# Patient Record
Sex: Female | Born: 2000
Health system: Southern US, Community
[De-identification: ages and names within clinical notes are randomized; demographics above are authoritative.]

## PROBLEM LIST (undated history)

## (undated) DIAGNOSIS — R519 Headache, unspecified: Secondary | ICD-10-CM

## (undated) DIAGNOSIS — J45909 Unspecified asthma, uncomplicated: Secondary | ICD-10-CM

## (undated) DIAGNOSIS — F329 Major depressive disorder, single episode, unspecified: Secondary | ICD-10-CM

## (undated) DIAGNOSIS — F32A Depression, unspecified: Secondary | ICD-10-CM

## (undated) HISTORY — DX: Depression, unspecified: F32.A

## (undated) HISTORY — DX: Unspecified asthma, uncomplicated: J45.909

## (undated) HISTORY — DX: Headache, unspecified: R51.9

---
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
## 2021-01-05 DIAGNOSIS — Z713 Dietary counseling and surveillance: Secondary | ICD-10-CM | POA: Diagnosis not present

## 2021-01-06 DIAGNOSIS — F502 Bulimia nervosa: Secondary | ICD-10-CM | POA: Diagnosis not present

## 2021-01-11 DIAGNOSIS — Z23 Encounter for immunization: Secondary | ICD-10-CM | POA: Diagnosis not present

## 2021-01-12 ENCOUNTER — Other Ambulatory Visit: Payer: Self-pay | Admitting: Pediatrics

## 2021-01-12 DIAGNOSIS — F411 Generalized anxiety disorder: Secondary | ICD-10-CM

## 2021-01-12 DIAGNOSIS — F331 Major depressive disorder, recurrent, moderate: Secondary | ICD-10-CM

## 2021-01-12 MED ORDER — SERTRALINE HCL 100 MG PO TABS: 100.0000 mg | ORAL_TABLET | Freq: Every day | ORAL | 1 refills | Status: DC

## 2021-01-19 DIAGNOSIS — Z713 Dietary counseling and surveillance: Secondary | ICD-10-CM | POA: Diagnosis not present

## 2021-01-20 DIAGNOSIS — F502 Bulimia nervosa: Secondary | ICD-10-CM | POA: Diagnosis not present

## 2021-01-24 DIAGNOSIS — E2839 Other primary ovarian failure: Secondary | ICD-10-CM | POA: Diagnosis not present

## 2021-01-24 DIAGNOSIS — F509 Eating disorder, unspecified: Secondary | ICD-10-CM | POA: Diagnosis not present

## 2021-01-25 DIAGNOSIS — F502 Bulimia nervosa: Secondary | ICD-10-CM | POA: Diagnosis not present

## 2021-01-26 DIAGNOSIS — Z713 Dietary counseling and surveillance: Secondary | ICD-10-CM | POA: Diagnosis not present

## 2021-02-09 DIAGNOSIS — Z713 Dietary counseling and surveillance: Secondary | ICD-10-CM | POA: Diagnosis not present

## 2021-02-24 DIAGNOSIS — Z713 Dietary counseling and surveillance: Secondary | ICD-10-CM | POA: Diagnosis not present

## 2021-03-23 DIAGNOSIS — Z713 Dietary counseling and surveillance: Secondary | ICD-10-CM | POA: Diagnosis not present

## 2021-06-30 DIAGNOSIS — R7989 Other specified abnormal findings of blood chemistry: Secondary | ICD-10-CM | POA: Diagnosis not present

## 2021-06-30 DIAGNOSIS — N912 Amenorrhea, unspecified: Secondary | ICD-10-CM | POA: Diagnosis not present

## 2021-07-12 ENCOUNTER — Ambulatory Visit (INDEPENDENT_AMBULATORY_CARE_PROVIDER_SITE_OTHER): Payer: BC Managed Care – PPO | Admitting: Family

## 2021-07-12 ENCOUNTER — Encounter: Payer: Self-pay | Admitting: Family

## 2021-07-12 VITALS — BP 100/59 | HR 62 | Ht 63.78 in | Wt 125.0 lb

## 2021-07-12 DIAGNOSIS — Z113 Encounter for screening for infections with a predominantly sexual mode of transmission: Secondary | ICD-10-CM

## 2021-07-12 DIAGNOSIS — N926 Irregular menstruation, unspecified: Secondary | ICD-10-CM | POA: Diagnosis not present

## 2021-07-12 DIAGNOSIS — F509 Eating disorder, unspecified: Secondary | ICD-10-CM

## 2021-07-12 DIAGNOSIS — F411 Generalized anxiety disorder: Secondary | ICD-10-CM | POA: Diagnosis not present

## 2021-07-12 DIAGNOSIS — Z1331 Encounter for screening for depression: Secondary | ICD-10-CM

## 2021-07-12 NOTE — Addendum Note (Signed)
Addended by: Ardeth Sportsman on: 07/12/2021 03:55 PM ? ? Modules accepted: Orders ? ?

## 2021-07-12 NOTE — Progress Notes (Signed)
History was provided by the patient and mother. ? ?Elizabeth Werner is a 21 y.o. female who is here for irregular periods, eating disorder, recovered, GAD.  ? ?PCP confirmed? Yes.   ? [name], MD ? ?Plan from last visit:  ? ?01/01/20 ?Assessment/Plan: ?1. Major depressive disorder, recurrent episode, moderate ([place]) ?Currently doing well without depressive sx.  ?  ?2. Generalized anxiety disorder ?Anxiety continues to be persistent. We will increase sertraline to 150 mg daily and hold there for 4 weeks. Could consider medication switch if this is not satisfactory.  ?  ?3. Eating disorder, unspecified type ?Stable.  ? ? ?-now at sertraline 200 mg  ?-last period in January - prolonged and dysmenorrhea with emotional symptoms  ?-[name], MD - [name], [place]; nutritionist: Dannette Barbara, [place] saw them late September/October  ? ?-in maintenance for [place] ? ?-not/never sexually active  ?-attracted to female partners  ? ?-has been getting really bloated, stomach feels sick since January  ?-feels like any of the times with bleeding no cramping  ? ?-headaches all her life; not worse or different; recent eye exam with perfect vision  ?-no purging since Christmas or since period in January  ?-no throat soreness or voice changes  ?-laxative use stopped in November/December  ?-no bingeing since then also  ?-[employer] 5 days per week, walking daily at school  ?-every days 3 meals + 2 snacks, probably would say needs more diverse nutrition ?-no follow up scheduled with nutritionist  ?-doesn't poop or feel like she poops enough  ?-hasn't had a lot of water since Friday because she likes to drink from [place] 40 oz x 2  ?-wisdom teeth out last Friday  ? ? ?Patient Active Problem List  ? Diagnosis Date Noted  ? Dumping syndrome 08/13/2019  ? Secondary oligomenorrhea 05/08/2019  ? Major depressive disorder, recurrent episode, moderate ([place]) 03/25/2019  ? Disordered eating 03/18/2019  ? Generalized anxiety disorder 11/20/2018   ? Adolescent idiopathic scoliosis of thoracic region 06/21/2016  ? ? ?Current Outpatient Medications on File Prior to Visit  ?Medication Sig Dispense Refill  ? albuterol (PROVENTIL HFA;VENTOLIN HFA) 108 (90 Base) MCG/ACT inhaler Inhale 2 puffs into the lungs every 6 (six) hours as needed for wheezing or shortness of breath. 1 Inhaler 0  ? sertraline (ZOLOFT) 100 MG tablet Take 1 tablet (100 mg total) by mouth daily. 30 tablet 1  ? ?No current facility-administered medications on file prior to visit.  ? ? ?No Known Allergies ? ?Physical Exam:  ?  ?Vitals:  ? 07/12/21 1012  ?BP: (!) 100/59  ?Pulse: 62  ?Weight: 125 lb (56.7 kg)  ?Height: 5' 3.78" (1.62 m)  ? ?Wt Readings from Last 3 Encounters:  ?07/12/21 125 lb (56.7 kg) (45 %, Z= -0.14)*  ?05/05/19 128 lb (58.1 kg) (60 %, Z= 0.26)*  ?04/07/19 124 lb 12.8 oz (56.6 kg) (55 %, Z= 0.12)*  ? ?* Growth percentiles are based on CDC (Girls, 2-20 Years) data.  ?  ? ?Blood pressure percentiles are not available for patients who are 18 years or older. ?No LMP recorded. ? ?Physical Exam ?Constitutional:   ?   General: She is not in acute distress. ?   Appearance: She is well-developed.  ?HENT:  ?   Head: Normocephalic and atraumatic.  ?Eyes:  ?   General: No scleral icterus. ?   Pupils: Pupils are equal, round, and reactive to light.  ?Neck:  ?   Thyroid: No thyromegaly.  ?Cardiovascular:  ?  Rate and Rhythm: Normal rate and regular rhythm.  ?   Heart sounds: Normal heart sounds. No murmur heard. ?Pulmonary:  ?   Effort: Pulmonary effort is normal.  ?   Breath sounds: Normal breath sounds.  ?Abdominal:  ?   Palpations: Abdomen is soft.  ?Musculoskeletal:     ?   General: Normal range of motion.  ?   Cervical back: Normal range of motion and neck supple.  ?Lymphadenopathy:  ?   Cervical: No cervical adenopathy.  ?Skin: ?   General: Skin is warm and dry.  ?   Findings: No rash.  ?   Comments: No acne  ?No appreciable hirsutism   ?Neurological:  ?   Mental Status: She is alert  and oriented to person, place, and time.  ?   Cranial Nerves: No cranial nerve deficit.  ?Psychiatric:     ?   Behavior: Behavior normal.     ?   Thought Content: Thought content normal.     ?   Judgment: Judgment normal.  ? ? ?  07/12/2021  ?  3:22 PM 12/23/2019  ?  9:20 AM 04/07/2019  ?  1:32 PM  ?PHQ-SADS Last 3 Score only  ?PHQ-15 Score 8 7 7   ?Total GAD-7 Score 4 8 7   ?PHQ Adolescent Score 5 3 4   ?  ? ?Assessment/Plan: ?1. Irregular periods ?2. Generalized anxiety disorder ?3. Eating disorder, unspecified type ? ?We discussed reasons for irregular cycles including H-P-O axis immaturity, thyroid, pituitary, and other endocrine or hypothalamic dysfunctions, other causes of ovulatory dysfunction secondary to hyperandrogenism, PCOS, and the possibility of structural or anatomical anomalies. Will obtain lab work today to rule in/rule out the above. Will review labs and notes from Dr [name] and GYN to assess what labs are additionally needed. Discussed progesterone challenge and need for GU exam at next visit. Weight and vitals stable.  ? ? ? ?

## 2021-07-13 LAB — C. TRACHOMATIS/N. GONORRHOEAE RNA
C. trachomatis RNA, TMA: NOT DETECTED
N. gonorrhoeae RNA, TMA: NOT DETECTED

## 2021-07-14 ENCOUNTER — Encounter: Payer: Self-pay | Admitting: Family

## 2021-07-14 ENCOUNTER — Other Ambulatory Visit: Payer: Self-pay | Admitting: Family

## 2021-07-14 DIAGNOSIS — N926 Irregular menstruation, unspecified: Secondary | ICD-10-CM

## 2021-07-18 ENCOUNTER — Ambulatory Visit: Payer: BC Managed Care – PPO

## 2021-07-18 DIAGNOSIS — Z1329 Encounter for screening for other suspected endocrine disorder: Secondary | ICD-10-CM | POA: Diagnosis not present

## 2021-07-18 DIAGNOSIS — E221 Hyperprolactinemia: Secondary | ICD-10-CM | POA: Diagnosis not present

## 2021-07-18 DIAGNOSIS — N926 Irregular menstruation, unspecified: Secondary | ICD-10-CM | POA: Diagnosis not present

## 2021-07-18 DIAGNOSIS — E559 Vitamin D deficiency, unspecified: Secondary | ICD-10-CM | POA: Diagnosis not present

## 2021-07-19 ENCOUNTER — Ambulatory Visit: Payer: BC Managed Care – PPO | Admitting: Family

## 2021-07-19 ENCOUNTER — Ambulatory Visit (INDEPENDENT_AMBULATORY_CARE_PROVIDER_SITE_OTHER): Payer: BC Managed Care – PPO | Admitting: Family

## 2021-07-19 ENCOUNTER — Other Ambulatory Visit: Payer: Self-pay | Admitting: Family

## 2021-07-19 ENCOUNTER — Encounter: Payer: Self-pay | Admitting: Family

## 2021-07-19 DIAGNOSIS — N921 Excessive and frequent menstruation with irregular cycle: Secondary | ICD-10-CM

## 2021-07-19 DIAGNOSIS — N926 Irregular menstruation, unspecified: Secondary | ICD-10-CM

## 2021-07-19 DIAGNOSIS — N914 Secondary oligomenorrhea: Secondary | ICD-10-CM

## 2021-07-19 DIAGNOSIS — N911 Secondary amenorrhea: Secondary | ICD-10-CM | POA: Diagnosis not present

## 2021-07-19 DIAGNOSIS — E785 Hyperlipidemia, unspecified: Secondary | ICD-10-CM | POA: Diagnosis not present

## 2021-07-19 NOTE — Progress Notes (Signed)
Here for additional labs as hormonal labs were not in last lab draw.  ?Video visit on Friday to review labs.  ? ? ?

## 2021-07-22 ENCOUNTER — Encounter: Payer: Self-pay | Admitting: Family

## 2021-07-22 ENCOUNTER — Telehealth: Payer: BC Managed Care – PPO | Admitting: Family

## 2021-07-25 LAB — COMPREHENSIVE METABOLIC PANEL
AG Ratio: 2 (calc) (ref 1.0–2.5)
ALT: 17 U/L (ref 5–32)
AST: 17 U/L (ref 12–32)
Albumin: 4.9 g/dL (ref 3.6–5.1)
Alkaline phosphatase (APISO): 50 U/L (ref 36–128)
BUN/Creatinine Ratio: 24 (calc) — ABNORMAL HIGH (ref 6–22)
BUN: 21 mg/dL — ABNORMAL HIGH (ref 7–20)
CO2: 28 mmol/L (ref 20–32)
Calcium: 9.5 mg/dL (ref 8.9–10.4)
Chloride: 104 mmol/L (ref 98–110)
Creat: 0.89 mg/dL (ref 0.50–0.96)
Globulin: 2.5 g/dL (calc) (ref 2.0–3.8)
Glucose, Bld: 65 mg/dL (ref 65–139)
Potassium: 4.1 mmol/L (ref 3.8–5.1)
Sodium: 139 mmol/L (ref 135–146)
Total Bilirubin: 0.4 mg/dL (ref 0.2–1.1)
Total Protein: 7.4 g/dL (ref 6.3–8.2)

## 2021-07-25 LAB — IRON, TOTAL/TOTAL IRON BINDING CAP
%SAT: 32 % (calc) (ref 15–45)
Iron: 102 ug/dL (ref 27–164)
TIBC: 318 mcg/dL (calc) (ref 271–448)

## 2021-07-25 LAB — THYROID PANEL WITH TSH
Free Thyroxine Index: 2 (ref 1.4–3.8)
T3 Uptake: 31 % (ref 22–35)
T4, Total: 6.4 ug/dL (ref 5.3–11.7)
TSH: 1.84 mIU/L

## 2021-07-25 LAB — PROTIME-INR
INR: 1.1
Prothrombin Time: 11.2 s (ref 9.0–11.5)

## 2021-07-25 LAB — PHOSPHORUS: Phosphorus: 3.4 mg/dL (ref 2.7–5.0)

## 2021-07-25 LAB — PROLACTIN: Prolactin: 12.7 ng/mL

## 2021-07-25 LAB — VITAMIN D 25 HYDROXY (VIT D DEFICIENCY, FRACTURES): Vit D, 25-Hydroxy: 46 ng/mL (ref 30–100)

## 2021-07-25 LAB — VON WILLEBRAND COMPREHENSIVE PANEL
Factor-VIII Activity: 60 % normal (ref 50–180)
Ristocetin Co-Factor: 42 % normal (ref 42–200)
Von Willebrand Antigen, Plasma: 58 % (ref 50–217)
aPTT: 31 s (ref 23–32)

## 2021-07-25 LAB — FOLLICLE STIMULATING HORMONE: FSH: 7.1 m[IU]/mL

## 2021-07-25 LAB — MAGNESIUM: Magnesium: 2.1 mg/dL (ref 1.5–2.5)

## 2021-07-25 LAB — FERRITIN: Ferritin: 102 ng/mL (ref 16–154)

## 2021-07-26 ENCOUNTER — Telehealth: Payer: BC Managed Care – PPO | Admitting: Family

## 2021-07-26 DIAGNOSIS — F411 Generalized anxiety disorder: Secondary | ICD-10-CM

## 2021-07-26 LAB — HEMOGLOBIN A1C
Hgb A1c MFr Bld: 5.1 % of total Hgb (ref <5.7)
Mean Plasma Glucose: 100 mg/dL
eAG (mmol/L): 5.5 mmol/L

## 2021-07-26 LAB — ESTRADIOL: Estradiol: <15 pg/mL

## 2021-07-26 LAB — DHEA-SULFATE: DHEA-SO4: 106 ug/dL (ref 44–286)

## 2021-07-26 LAB — TEST AUTHORIZATION

## 2021-07-26 LAB — LUTEINIZING HORMONE: LH: 2.2 m[IU]/mL

## 2021-07-26 LAB — ANTI-MULLERIAN HORMONE (AMH), FEMALE: Anti-Mullerian Hormones(AMH), Female: 2.18 ng/mL (ref 1.02–14.63)

## 2021-07-26 LAB — LIPID PANEL
Cholesterol: 185 mg/dL — ABNORMAL HIGH (ref <170)
HDL: 68 mg/dL (ref >45)
LDL Cholesterol (Calc): 104 mg/dL (calc) (ref <110)
Non-HDL Cholesterol (Calc): 117 mg/dL (calc) (ref <120)
Total CHOL/HDL Ratio: 2.7 (calc) (ref <5.0)
Triglycerides: 51 mg/dL (ref <90)

## 2021-07-26 LAB — FOLLICLE STIMULATING HORMONE: FSH: 7.5 m[IU]/mL

## 2021-07-26 LAB — 17-HYDROXYPROGESTERONE: 17-OH-Progesterone, LC/MS/MS: 23 ng/dL

## 2021-07-26 LAB — TESTOS,TOTAL,FREE AND SHBG (FEMALE)
Free Testosterone: 4.1 pg/mL (ref 0.1–6.4)
Sex Hormone Binding: 95 nmol/L (ref 17–124)
Testosterone, Total, LC-MS-MS: 41 ng/dL (ref 2–45)

## 2021-07-26 LAB — ANDROSTENEDIONE: Androstenedione: 91 ng/dL

## 2021-07-27 ENCOUNTER — Other Ambulatory Visit: Payer: Self-pay | Admitting: Family

## 2021-07-27 ENCOUNTER — Encounter: Payer: Self-pay | Admitting: Family

## 2021-07-27 DIAGNOSIS — N914 Secondary oligomenorrhea: Secondary | ICD-10-CM

## 2021-07-27 MED ORDER — MEDROXYPROGESTERONE ACETATE 10 MG PO TABS: 10.0000 mg | ORAL_TABLET | Freq: Every day | ORAL | 0 refills | Status: DC

## 2021-07-28 ENCOUNTER — Other Ambulatory Visit: Payer: Self-pay | Admitting: Family

## 2021-07-28 DIAGNOSIS — F411 Generalized anxiety disorder: Secondary | ICD-10-CM

## 2021-07-28 DIAGNOSIS — F331 Major depressive disorder, recurrent, moderate: Secondary | ICD-10-CM

## 2021-07-28 MED ORDER — SERTRALINE HCL 100 MG PO TABS: 100.0000 mg | ORAL_TABLET | Freq: Every day | ORAL | 0 refills | Status: DC

## 2021-08-09 ENCOUNTER — Other Ambulatory Visit: Payer: Self-pay | Admitting: Family

## 2021-08-09 DIAGNOSIS — N914 Secondary oligomenorrhea: Secondary | ICD-10-CM

## 2021-08-09 DIAGNOSIS — E348 Other specified endocrine disorders: Secondary | ICD-10-CM

## 2021-08-10 ENCOUNTER — Other Ambulatory Visit: Payer: Self-pay | Admitting: Family

## 2021-08-10 DIAGNOSIS — E348 Other specified endocrine disorders: Secondary | ICD-10-CM

## 2021-08-10 DIAGNOSIS — N914 Secondary oligomenorrhea: Secondary | ICD-10-CM

## 2021-08-12 ENCOUNTER — Ambulatory Visit
Admission: RE | Admit: 2021-08-12 | Discharge: 2021-08-12 | Disposition: A | Discharge status: Home / Self Care | Payer: BC Managed Care – PPO | Source: Ambulatory Visit | Attending: Family | Admitting: Family

## 2021-08-12 ENCOUNTER — Other Ambulatory Visit: Payer: BC Managed Care – PPO

## 2021-08-12 DIAGNOSIS — E348 Other specified endocrine disorders: Secondary | ICD-10-CM | POA: Diagnosis not present

## 2021-08-12 DIAGNOSIS — N915 Oligomenorrhea, unspecified: Secondary | ICD-10-CM | POA: Diagnosis not present

## 2021-08-12 DIAGNOSIS — N914 Secondary oligomenorrhea: Secondary | ICD-10-CM

## 2021-08-13 LAB — LUTEINIZING HORMONE: LH: 0.4 m[IU]/mL — ABNORMAL LOW

## 2021-08-13 LAB — ESTRADIOL: Estradiol: 19 pg/mL

## 2021-08-13 LAB — FOLLICLE STIMULATING HORMONE: FSH: 9.1 m[IU]/mL

## 2021-08-17 ENCOUNTER — Other Ambulatory Visit: Payer: Self-pay | Admitting: Family

## 2021-08-17 ENCOUNTER — Encounter: Payer: Self-pay | Admitting: Family

## 2021-08-17 DIAGNOSIS — N912 Amenorrhea, unspecified: Secondary | ICD-10-CM

## 2021-08-17 DIAGNOSIS — E348 Other specified endocrine disorders: Secondary | ICD-10-CM

## 2021-08-25 ENCOUNTER — Encounter: Payer: Self-pay | Admitting: Family

## 2021-08-25 ENCOUNTER — Ambulatory Visit (INDEPENDENT_AMBULATORY_CARE_PROVIDER_SITE_OTHER): Payer: BC Managed Care – PPO | Admitting: Family

## 2021-08-25 VITALS — BP 104/64 | HR 68 | Wt 122.4 lb

## 2021-08-25 DIAGNOSIS — E348 Other specified endocrine disorders: Secondary | ICD-10-CM

## 2021-08-25 DIAGNOSIS — N912 Amenorrhea, unspecified: Secondary | ICD-10-CM | POA: Diagnosis not present

## 2021-08-25 DIAGNOSIS — F509 Eating disorder, unspecified: Secondary | ICD-10-CM | POA: Diagnosis not present

## 2021-08-25 DIAGNOSIS — K911 Postgastric surgery syndromes: Secondary | ICD-10-CM

## 2021-08-25 NOTE — Progress Notes (Unsigned)
History was provided by the {[email or web address]:[id number]}.  Elizabeth Werner is a 21 y.o. female who is here for ***.   PCP confirmed? 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 90 tablet 0   albuterol (PROVENTIL HFA;VENTOLIN HFA) 108 (90 Base) MCG/ACT inhaler Inhale 2 puffs into the lungs every 6 (six) hours as needed for wheezing or shortness of breath. (Patient not taking: Reported on 08/25/2021) 1 Inhaler 0   medroxyPROGESTERone (PROVERA) 10 MG tablet Take 1 tablet (10 mg total) by mouth daily. (Patient not taking: Reported on 08/25/2021) 10 tablet 0   No current facility-administered medications on file prior to visit.    No Known Allergies  Physical Exam:    Vitals:   08/25/21 1432  BP: 104/64  Pulse: 68  Weight: 122 lb 6.4 oz (55.5 kg)    Blood pressure %iles are not available for patients who are 18 years or older. No LMP recorded.  Physical Exam   Assessment/Plan: ***

## 2021-08-26 ENCOUNTER — Telehealth: Payer: Self-pay | Admitting: Pediatrics

## 2021-08-26 ENCOUNTER — Telehealth: Payer: Self-pay | Admitting: Family

## 2021-08-26 ENCOUNTER — Encounter: Payer: Self-pay | Admitting: Family

## 2021-08-26 NOTE — Telephone Encounter (Signed)
Patient appointment was scheduled for Sunday had to be cancel due to needing an authorization. Once authorization is obtained appointment can be rescheduled.

## 2021-08-26 NOTE — Telephone Encounter (Signed)
Received a call form [facility] stating that patient is schedule to have an MRI on Sunday but need prior authorization from provider. She states that they need to receive this authorization by 11:00am today otherwise the appointment will be cancel.  [phone]

## 2021-08-28 ENCOUNTER — Encounter: Payer: Self-pay | Admitting: Family

## 2021-08-28 ENCOUNTER — Other Ambulatory Visit: Payer: BC Managed Care – PPO

## 2021-09-10 ENCOUNTER — Other Ambulatory Visit: Payer: Self-pay | Admitting: Pediatrics

## 2021-09-14 ENCOUNTER — Other Ambulatory Visit: Payer: Self-pay | Admitting: Pediatrics

## 2021-09-14 DIAGNOSIS — F411 Generalized anxiety disorder: Secondary | ICD-10-CM

## 2021-09-14 DIAGNOSIS — F331 Major depressive disorder, recurrent, moderate: Secondary | ICD-10-CM

## 2021-09-14 MED ORDER — SERTRALINE HCL 100 MG PO TABS: 100.0000 mg | ORAL_TABLET | Freq: Every day | ORAL | 0 refills | Status: DC

## 2021-09-20 ENCOUNTER — Ambulatory Visit: Payer: BC Managed Care – PPO | Admitting: Family

## 2021-09-29 ENCOUNTER — Encounter: Payer: BC Managed Care – PPO | Admitting: Obstetrics and Gynecology

## 2021-10-03 ENCOUNTER — Encounter: Payer: Self-pay | Admitting: Family

## 2021-10-03 ENCOUNTER — Telehealth: Payer: Self-pay

## 2021-10-03 ENCOUNTER — Ambulatory Visit (INDEPENDENT_AMBULATORY_CARE_PROVIDER_SITE_OTHER): Payer: BC Managed Care – PPO | Admitting: Family

## 2021-10-03 VITALS — BP 104/64 | HR 65 | Ht 64.0 in | Wt 120.6 lb

## 2021-10-03 DIAGNOSIS — F509 Eating disorder, unspecified: Secondary | ICD-10-CM

## 2021-10-03 DIAGNOSIS — N912 Amenorrhea, unspecified: Secondary | ICD-10-CM | POA: Diagnosis not present

## 2021-10-03 DIAGNOSIS — R634 Abnormal weight loss: Secondary | ICD-10-CM

## 2021-10-03 DIAGNOSIS — E348 Other specified endocrine disorders: Secondary | ICD-10-CM

## 2021-10-03 DIAGNOSIS — F4323 Adjustment disorder with mixed anxiety and depressed mood: Secondary | ICD-10-CM | POA: Diagnosis not present

## 2021-10-03 MED ORDER — ESCITALOPRAM OXALATE 10 MG PO TABS: 10.0000 mg | ORAL_TABLET | Freq: Every day | ORAL | 0 refills | Status: DC

## 2021-10-03 NOTE — Telephone Encounter (Signed)
Mom requesting a call from Bernell List directly regarding today's appointment.

## 2021-10-03 NOTE — Progress Notes (Signed)
History was provided by the patient.  Elizabeth Werner is a 21 y.o. female who is here for adjustment disorder mixed anxiety and depressed mood, weight loss, amenorrhea.   PCP confirmed? Yes.    Elizabeth Hacker, MD  Plan from last visit on 08/25/21:  Assessment/Plan: 1. Amenorrhea 2. Estradiol deficiency   -GU exam today, external only with Q-tip pass through introitus without difficulty -review of growth metrics and growth chart, her amenorrhea may be 2/2 weight loss or weight changes at this time, particularly if her growth charts during childhood have her at 50-75th%tile, which would indicate she has more weight to restore. Of note, she had back to back periods in Jan and Feb 2021 at what time her weight was 128 lbs, so she may have ~ 5-10 lbs before period returns. Although never moderately or severely malnourished, her weight loss happened after late menarche and before her H-P-O axis matured. Her lowest recorded weight since menarche was 113 lbs on October 2020. Normal DXA on 01/2021.  -Due to low estradiol, no L ovary visualized, and small uterus noted on ultrasound, will refer to Elizabeth Werner for her expertise and for Elizabeth Werner to establish GYN care -discussed with Elizabeth Werner today that we will continue to focus on restoration and her relationship with food, anxiety.  -nutrition referral to Elizabeth Werner, [facility]  -no changes in sertraline today   -late entry: spoke to insurance company; PA not approved, will hold on MRI until Elizabeth Werner consult for her recommendations on imaging. Advised to [facility] via My Chart no imaging or any other work-up needed until her appt.  Will follow up 7/31 again.    Growth Metrics: ; Body mass index is 21.16 kg/m. Age in Months: 19 50th% median BMI for age: 103.68 % Ideal BMI 97.6%   Pertinent labs:  estradiol 19 (2 wks ago), <15 (one month ago) and 41 (2 yr ago) LH: 0.4 FSH: 9.1    Period history:  Reported periods back to back Jan and Feb 2021  per office visit 05/05/19 - of note, weight at that visit was 128 lbs    HPI:   -has taken sertraline for so long, she doesn't know if it is helping or not -gets irritable and mad; sometimes out of nowhere and then other times will just wake up in bad mood  -has started restricting and feels that is the reason  -poops are like nonexistent  -no therapist - has tried 4 in the past; saw last therapist 8-10 times  -takes sertraline at night  -recently has been having early morning wakings  -going back to school in 2-3 weeks  -new deodorant, has been smelling bad -     Patient Active Problem List   Diagnosis Date Noted   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 90 tablet 0   albuterol (PROVENTIL HFA;VENTOLIN HFA) 108 (90 Base) MCG/ACT inhaler Inhale 2 puffs into the lungs every 6 (six) hours as needed for wheezing or shortness of breath. (Patient not taking: Reported on 08/25/2021) 1 Inhaler 0   medroxyPROGESTERone (PROVERA) 10 MG tablet Take 1 tablet (10 mg total) by  mouth daily. (Patient not taking: Reported on 08/25/2021) 10 tablet 0   No current facility-administered medications on file prior to visit.    No Known Allergies  Physical Exam:    Vitals:   10/03/21 1340  BP: 104/64  Pulse: 65  Weight: 120 lb 9.6 oz (54.7 kg)  Height: 5\' 4"  (1.626 m)   Wt Readings from Last 3 Encounters:  10/03/21 120 lb 9.6 oz (54.7 kg) (35 %, Z= -0.38)*  08/25/21 122 lb 6.4 oz (55.5 kg) (39 %, Z= -0.28)*  07/12/21 125 lb (56.7 kg) (45 %, Z= -0.14)*   * Growth percentiles are based on CDC  (Girls, 2-20 Years) data.     Blood pressure %iles are not available for patients who are 18 years or older. No LMP recorded.  Physical Exam Constitutional:      General: She is not in acute distress.    Appearance: She is well-developed.  HENT:     Head: Normocephalic and atraumatic.  Eyes:     General: No scleral icterus.    Pupils: Pupils are equal, round, and reactive to light.  Neck:     Thyroid: No thyromegaly.  Cardiovascular:     Rate and Rhythm: Normal rate and regular rhythm.     Heart sounds: Normal heart sounds. No murmur heard. Pulmonary:     Effort: Pulmonary effort is normal.     Breath sounds: Normal breath sounds.  Musculoskeletal:        General: Normal range of motion.     Cervical back: Normal range of motion and neck supple.  Lymphadenopathy:     Cervical: No cervical adenopathy.  Skin:    General: Skin is warm and dry.     Findings: No rash.  Neurological:     Mental Status: She is alert and oriented to person, place, and time.     Cranial Nerves: No cranial nerve deficit.     Motor: No tremor.  Psychiatric:        Mood and Affect: Mood is anxious.        Behavior: Behavior normal.        Thought Content: Thought content normal.        Judgment: Judgment normal.        10/03/2021    4:28 PM 08/28/2021    7:55 PM 07/12/2021    3:22 PM  PHQ-SADS Last 3 Score only  PHQ-15 Score 5 3 8   Total GAD-7 Score 5 4 4   PHQ Adolescent Score 9  5    Assessment/Plan: 1. Adjustment disorder with mixed anxiety and depressed mood 2. Weight loss 3. Eating disorder, unspecified type 4. Amenorrhea 5. Estradiol deficiency -pending GYN appt scheduled; weight loss likely contributing; no clear menstrual pattern established, low estrogen, and one ovary noted on pelvic ultrasound with small uterus size -referral to [facility] for ongoing support - nutritional, therapy and medical monitoring; no evidence of B/P tendencies but does endorse awareness of restricting  behaviors presenting  -has been on sertraline 100 mg for years; will switch to Lexapro 10 mg; her sister had significant improvement in anxiety management from same medication change.

## 2021-10-03 NOTE — Patient Instructions (Signed)
Stop taking sertraline and start taking Lexapro 10 mg.   I will place a referral for [facility] for you!   Try Miralax 1-2 capfuls in 8-10 oz water daily to help with daily constipation.   Let me know how you are feeling through My Chart and I will see you again in 2 weeks or sooner if needed!

## 2021-10-04 ENCOUNTER — Telehealth: Payer: Self-pay | Admitting: Family

## 2021-10-04 NOTE — Telephone Encounter (Signed)
TC to mom. Mom had questions about recent appt and referral process to [facility]. Advised that I would route phone call to Rowland Lathe to follow-up if needed about referral process.

## 2021-10-07 NOTE — Progress Notes (Signed)
Closed for admin purposes

## 2021-10-07 NOTE — Progress Notes (Unsigned)
GYNECOLOGY OFFICE VISIT NOTE  History:   Elizabeth Werner is a 21 y.o. G0P0000 here today for secondary amenorrhea. She got her period at 66 (same as her mom). It was regular for about 6 months. Then during covid she developed the disordered eating of restricting and purging and her cycle stopped. She then improved for a period time and got her period once spontaneously. She then worsened more recently and is working on that.   She has had a workup with her PCP which showed free testosterone of 4.1, normal AMH 2.18, and borderline low estrogen and LH at times. 17OHP was normal.  She had a transABDOMINAL US showed overall small uterus and nonvisualized left ovary.  She had done provera and for withdrawal bleed she bled the first time two years ago but more recently did not.   Unwanted hair growth: Denies any changes to her baseline hair amount Acne: Denies.      Past Medical History:  Diagnosis Date   Asthma    exercise induced   Depression    Frequent headaches     History reviewed. No pertinent surgical history.  The following portions of the patient's history were reviewed and updated as appropriate: allergies, current medications, past family history, past medical history, past social history, past surgical history and problem list.   Health Maintenance:   Not yet indicated  Review of Systems:  Pertinent items noted in HPI and remainder of comprehensive ROS otherwise negative.  Physical Exam:  BP (!) 94/58   Pulse 65   Ht 5\' 4"  (1.626 m)   Wt 119 lb (54 kg)   LMP  (LMP Unknown)   BMI 20.43 kg/m  CONSTITUTIONAL: Well-developed, well-nourished female in no acute distress.  HEENT:  Normocephalic, atraumatic. External right and left ear normal. No scleral icterus.  NECK: Normal range of motion, supple, no masses noted on observation SKIN: No rash noted. Not diaphoretic. No erythema. No pallor. MUSCULOSKELETAL: Normal range of motion. No edema noted. NEUROLOGIC: Alert  and oriented to person, place, and time. Normal muscle tone coordination. No cranial nerve deficit noted. PSYCHIATRIC: Normal mood and affect. Normal behavior. Normal judgment and thought content.  CARDIOVASCULAR: Normal heart rate noted RESPIRATORY: Effort and breath sounds normal, no problems with respiration noted ABDOMEN: No masses noted. No other overt distention noted.    PELVIC: Deferred  Labs and Imaging No results found for this or any previous visit (from the past 168 hour(s)). No results found.  Assessment and Plan:  Elizabeth Werner was seen today for amenorrhea.  Diagnoses and all orders for this visit:  Hypogonadotropic hypogonadism (HCC)  - Discussed differential for oligomenorrhea - based on labwork and lack of clinical symptoms of hyperandrogenism, it is likely due to disordered eating and weight loss causing HGHG.  - Lab work was all normal as noted above - Elizabeth Werner is normal and would not recommend MRI. Most likely non-visualization is due to it being done transabdominal. Would do TVUS once appopriate.  - Reviewed options available for cardiac and bone protection. E+P physiologic regimen versus OCP. Reviewed pros and cons of each. She would like to consider and send me a message with her preference.  - We discussed impact on fertility and measures available to help with it. I.e. injectable GNRH if cycles did not respond to improved eating habits and increased body fat.   Routine preventative health maintenance measures emphasized. Please refer to After Visit Summary for other counseling recommendations.   No follow-ups on file.  Radene Gunning, MD, [place] for [facility], [place]

## 2021-10-10 ENCOUNTER — Ambulatory Visit: Payer: BC Managed Care – PPO | Admitting: Family

## 2021-10-13 ENCOUNTER — Ambulatory Visit: Payer: BC Managed Care – PPO | Admitting: Obstetrics and Gynecology

## 2021-10-13 ENCOUNTER — Encounter: Payer: Self-pay | Admitting: Obstetrics and Gynecology

## 2021-10-13 VITALS — BP 94/58 | HR 65 | Ht 64.0 in | Wt 119.0 lb

## 2021-10-13 DIAGNOSIS — E230 Hypopituitarism: Secondary | ICD-10-CM

## 2021-10-13 DIAGNOSIS — N915 Oligomenorrhea, unspecified: Secondary | ICD-10-CM

## 2021-10-13 NOTE — Progress Notes (Signed)
Pt has never been sexually active LMP unknown

## 2021-10-17 ENCOUNTER — Ambulatory Visit (INDEPENDENT_AMBULATORY_CARE_PROVIDER_SITE_OTHER): Payer: BC Managed Care – PPO | Admitting: Family

## 2021-10-17 ENCOUNTER — Encounter: Payer: Self-pay | Admitting: Family

## 2021-10-17 VITALS — BP 87/52 | HR 60 | Ht 63.48 in | Wt 119.4 lb

## 2021-10-17 DIAGNOSIS — F509 Eating disorder, unspecified: Secondary | ICD-10-CM | POA: Diagnosis not present

## 2021-10-17 DIAGNOSIS — F4323 Adjustment disorder with mixed anxiety and depressed mood: Secondary | ICD-10-CM

## 2021-10-17 NOTE — Progress Notes (Signed)
History was provided by the patient.  Elizabeth Werner is a 21 y.o. female who is here for adjustment disorder with mixed anxiety and depressed mood, eating disorder, unspecified.   PCP confirmed? Yes.    Aggie Hacker, MD  HPI:    -had some nausea and headaches with switch from sertraline to lexapro  -maybe seeing improvement?  -sometimes will get in her head at night -saw Dr Para March; likely eating is leading cause of it but not 100% - concern for heart and bone health - could go on COC or E/P regimen -needs to get eating straight and work on that   -day to day: eat 3 meals plus 2 snacks; probably not enough at each time  -emailed Pattricia Boss to set up appt   -goes back to [facility] on 8/15; living in apartment; has single room in 3 BR apartment   24 hr food recall:  -oatmeal plain because she woke up at 12 -getting lunch after this  -tortilla rollups, oatmeal, pistachios, pizza, muffin -yesterday drank more water because she was at golf tournament  -not good about taking Lexapro - will take it throughout the day at random times when she remembers and can tell when she doesn't take it      10/17/2021    2:56 PM 10/03/2021    4:28 PM 08/28/2021    7:55 PM  PHQ-SADS Last 3 Score only  PHQ-15 Score 7 5 3   Total GAD-7 Score 7 5 4   PHQ Adolescent Score 6 9      Patient Active Problem List   Diagnosis Date Noted   Dumping syndrome 08/13/2019   Secondary oligomenorrhea 05/08/2019   Major depressive disorder, recurrent episode, moderate (HCC) 03/25/2019   Disordered eating 03/18/2019   Generalized anxiety disorder 11/20/2018   Adolescent idiopathic scoliosis of thoracic region 06/21/2016    Current Outpatient Medications on File Prior to Visit  Medication Sig Dispense Refill   escitalopram (LEXAPRO) 10 MG tablet Take 1 tablet (10 mg total) by mouth daily. 30 tablet 0   No current facility-administered medications on file prior to visit.    No Known Allergies  Physical Exam:     Vitals:   10/17/21 1423 10/17/21 1449  BP: (!) 79/51 (!) 87/52  Pulse: 60 60  Weight: 119 lb 6.4 oz (54.2 kg)   Height: 5' 3.48" (1.612 m)    Wt Readings from Last 3 Encounters:  10/17/21 119 lb 6.4 oz (54.2 kg) (33 %, Z= -0.45)*  10/13/21 119 lb (54 kg) (32 %, Z= -0.47)*  10/03/21 120 lb 9.6 oz (54.7 kg) (35 %, Z= -0.38)*   * Growth percentiles are based on CDC (Girls, 2-20 Years) data.     Blood pressure %iles are not available for patients who are 18 years or older. No LMP recorded (lmp unknown).  Physical Exam Constitutional:      General: She is not in acute distress.    Appearance: She is well-developed.  HENT:     Head: Normocephalic and atraumatic.  Eyes:     General: No scleral icterus.    Pupils: Pupils are equal, round, and reactive to light.  Neck:     Thyroid: No thyromegaly.  Cardiovascular:     Rate and Rhythm: Normal rate and regular rhythm.     Heart sounds: Normal heart sounds. No murmur heard. Pulmonary:     Effort: Pulmonary effort is normal.     Breath sounds: Normal breath sounds.  Musculoskeletal:  General: Normal range of motion.     Cervical back: Normal range of motion and neck supple.  Lymphadenopathy:     Cervical: No cervical adenopathy.  Skin:    General: Skin is warm and dry.     Findings: No rash.  Neurological:     Mental Status: She is alert and oriented to person, place, and time.     Cranial Nerves: No cranial nerve deficit.     Motor: No tremor.  Psychiatric:        Attention and Perception: Attention normal.        Mood and Affect: Mood normal.        Behavior: Behavior normal.        Thought Content: Thought content normal.        Judgment: Judgment normal.      Assessment/Plan: 1. Adjustment disorder with mixed anxiety and depressed mood 2. Eating disorder, unspecified type  -slightly improved depressive screening scores today; slightly elevated anxiety and somatic symptoms noted today, however has had  inconsistent intake of Lexapro with appreciable withdrawal symptoms noted with missed doses.  Will continue to monitor symptoms; discussed referral to [facility] or exception for coverage through [facility] if possible for ongoing management of DE. One month video follow up or sooner as needed. Discussed low BP - advised increase in water intake, increase density of snack/food choices

## 2021-10-25 ENCOUNTER — Other Ambulatory Visit: Payer: Self-pay | Admitting: Family

## 2021-10-25 DIAGNOSIS — F331 Major depressive disorder, recurrent, moderate: Secondary | ICD-10-CM

## 2021-10-25 DIAGNOSIS — F411 Generalized anxiety disorder: Secondary | ICD-10-CM

## 2021-10-26 ENCOUNTER — Other Ambulatory Visit: Payer: Self-pay | Admitting: Family

## 2021-11-01 DIAGNOSIS — Z713 Dietary counseling and surveillance: Secondary | ICD-10-CM | POA: Diagnosis not present

## 2021-11-18 ENCOUNTER — Telehealth: Payer: Self-pay

## 2021-11-18 NOTE — Telephone Encounter (Signed)
Spoke with mom. She wanted to share a few things with Alyse Low before Eve's virtual visit on Tuesday. [facility] was unable to accept referral due to insurance. Of note, Chelle Dellis Filbert was recommended but Lyndee Leo declined, per [employer] thread.  Deneise Lever recently met with Dannette Barbara RD. Mom wants to make sure Alyse Low discusses this visit with Carrissa to see how it went. Consent was signed. Mom would like for Christy to connect with Deneise Lever to ensure the care team is all on the same page.  Sanyia spoke with GYN last month. GYN is recommending progesterone or BC due to menstrual irregularity. Mom wants Christy's recommendations on this as well.

## 2021-11-22 ENCOUNTER — Telehealth: Payer: BC Managed Care – PPO | Admitting: Family

## 2021-11-23 ENCOUNTER — Telehealth: Payer: BC Managed Care – PPO | Admitting: Family

## 2021-11-29 ENCOUNTER — Encounter: Payer: Self-pay | Admitting: Family

## 2021-11-30 ENCOUNTER — Encounter: Payer: Self-pay | Admitting: Family

## 2021-11-30 ENCOUNTER — Telehealth (INDEPENDENT_AMBULATORY_CARE_PROVIDER_SITE_OTHER): Payer: BC Managed Care – PPO | Admitting: Family

## 2021-11-30 ENCOUNTER — Telehealth: Payer: BC Managed Care – PPO | Admitting: Family

## 2021-11-30 DIAGNOSIS — F4323 Adjustment disorder with mixed anxiety and depressed mood: Secondary | ICD-10-CM | POA: Diagnosis not present

## 2021-11-30 DIAGNOSIS — F509 Eating disorder, unspecified: Secondary | ICD-10-CM | POA: Diagnosis not present

## 2021-11-30 NOTE — Progress Notes (Signed)
THIS RECORD MAY CONTAIN CONFIDENTIAL INFORMATION THAT SHOULD NOT BE RELEASED WITHOUT REVIEW OF THE SERVICE PROVIDER.  Virtual Follow-Up Visit via Video Note  I connected with Elizabeth Werner  on 11/30/21 at 11:30 AM EDT by a video enabled telemedicine application and verified that I am speaking with the correct person using two identifiers.   Patient/parent location: apartment, [facility] Provider location: [facility] office    I discussed the limitations of evaluation and management by telemedicine and the availability of in person appointments.  I discussed that the purpose of this telehealth visit is to provide medical care while limiting exposure to the novel coronavirus.  The patient expressed understanding and agreed to proceed.   Elizabeth Werner is a 21 y.o. female referred by Aggie Hacker, MD here today for follow-up of adjustment disorder with mixed anxiety and depressed mood, .   History was provided by the patient.  Supervising Physician: Dr. Theadore Nan   Plan from Last Visit:   Assessment/Plan 10/17/21: 1. Adjustment disorder with mixed anxiety and depressed mood 2. Eating disorder, unspecified type   -slightly improved depressive screening scores today; slightly elevated anxiety and somatic symptoms noted today, however has had inconsistent intake of Lexapro with appreciable withdrawal symptoms noted with missed doses.  Will continue to monitor symptoms; discussed referral to [facility] or exception for coverage through [facility] if possible for ongoing management of DE. One month video follow up or sooner as needed. Discussed low BP - advised increase in water intake, increase density of snack/food choices   Chief Complaint: Adjustment disorder with mixed anxiety and depressed mood  History of Present Illness:  -can tell difference with lexapro vs sertraline; does feel it is working  -has seen Pattricia Boss (RD) once since she has been to [facility]; has signed ROI and would like  for [place] to connect -is open to seeing Ninetta Lights at [facility] for ongoing DE care  -school going well, is busy but she likes to stay busy      11/30/2021   11:29 AM 10/17/2021    2:56 PM 10/03/2021    4:28 PM  PHQ-SADS Last 3 Score only  PHQ-15 Score 6 7 5   Total GAD-7 Score 3 7 5   PHQ Adolescent Score 4 6 9    We reviewed these findings.   No Known Allergies Outpatient Medications Prior to Visit  Medication Sig Dispense Refill   escitalopram (LEXAPRO) 10 MG tablet TAKE 1 TABLET BY MOUTH EVERY DAY 90 tablet 1   No facility-administered medications prior to visit.     Patient Active Problem List   Diagnosis Date Noted   Dumping syndrome 08/13/2019   Secondary oligomenorrhea 05/08/2019   Major depressive disorder, recurrent episode, moderate (HCC) 03/25/2019   Disordered eating 03/18/2019   Generalized anxiety disorder 11/20/2018   Adolescent idiopathic scoliosis of thoracic region 06/21/2016    The following portions of the patient's history were reviewed and updated as appropriate: allergies, current medications, past family history, past medical history, past social history, past surgical history, and problem list.  Visual Observations/Objective:   General Appearance: Well nourished well developed, in no apparent distress.  Eyes: conjunctiva no swelling or erythema ENT/Mouth: No hoarseness, No cough for duration of visit.  Neck: Supple  Respiratory: Respiratory effort normal, normal rate, no retractions or distress.   Cardio: Appears well-perfused, noncyanotic Musculoskeletal: no obvious deformity Skin: visible skin without rashes, ecchymosis, erythema Neuro: Awake and oriented X 3,  Psych:  normal affect, Insight and Judgment appropriate. Brighter  affect.    Assessment/Plan: 1. Adjustment disorder with mixed anxiety and depressed mood -increase lexapro from 10 mg to 15 mg  -return precautions reviewed; check back in 2 weeks or sooner if needed   2. Eating disorder,  unspecified type -referral to [facility] for ongoing management, chart to Rosemarie Beath  -will confirm on ROI for Deneise Lever and reach out    [facility] screenings:     11/30/2021   11:29 AM 10/17/2021    2:56 PM 10/03/2021    4:28 PM  PHQ-SADS Last 3 Score only  PHQ-15 Score 6 7 5   Total GAD-7 Score 3 7 5   PHQ Adolescent Score 4 6 9     Screens discussed with patient and parent and adjustments to plan made accordingly.   I discussed the assessment and treatment plan with the patient and/or parent/guardian.  They were provided an opportunity to ask questions and all were answered.  They agreed with the plan and demonstrated an understanding of the instructions. They were advised to call back or seek an in-person evaluation in the emergency room if the symptoms worsen or if the condition fails to improve as anticipated.   Follow-up:   2 weeks med check, video    Parthenia Ames, NP    CC: [name], MD, [name], MD

## 2021-12-14 ENCOUNTER — Telehealth: Payer: BC Managed Care – PPO | Admitting: Family

## 2022-01-03 ENCOUNTER — Telehealth: Payer: BC Managed Care – PPO | Admitting: Family

## 2022-01-03 ENCOUNTER — Encounter: Payer: Self-pay | Admitting: Family

## 2022-01-03 DIAGNOSIS — F509 Eating disorder, unspecified: Secondary | ICD-10-CM

## 2022-01-03 DIAGNOSIS — F4323 Adjustment disorder with mixed anxiety and depressed mood: Secondary | ICD-10-CM

## 2022-01-03 NOTE — Progress Notes (Signed)
TC to mom. Mom concerned and wanting to know recommendations to get Lyndee Leo support - nutrition, counseling and ongoing medical management. We discussed referral to Dr Guy Sandifer for ongoing care. Will route chart to Rosemarie Beath for assistance in reaching out to [facility] and Dr Guy Sandifer. Mom appreciative of call.

## 2022-01-06 ENCOUNTER — Encounter: Payer: Self-pay | Admitting: Family

## 2022-01-17 ENCOUNTER — Telehealth: Payer: BC Managed Care – PPO | Admitting: Family

## 2022-01-17 ENCOUNTER — Encounter: Payer: Self-pay | Admitting: Family

## 2022-01-18 ENCOUNTER — Telehealth (INDEPENDENT_AMBULATORY_CARE_PROVIDER_SITE_OTHER): Payer: BC Managed Care – PPO | Admitting: Family

## 2022-01-18 ENCOUNTER — Encounter: Payer: Self-pay | Admitting: Family

## 2022-01-18 DIAGNOSIS — F4323 Adjustment disorder with mixed anxiety and depressed mood: Secondary | ICD-10-CM

## 2022-01-18 DIAGNOSIS — F509 Eating disorder, unspecified: Secondary | ICD-10-CM | POA: Diagnosis not present

## 2022-01-18 MED ORDER — ESCITALOPRAM OXALATE 10 MG PO TABS: 15.0000 mg | ORAL_TABLET | Freq: Every day | ORAL | 0 refills | Status: DC

## 2022-01-18 NOTE — Progress Notes (Signed)
THIS RECORD MAY CONTAIN CONFIDENTIAL INFORMATION THAT SHOULD NOT BE RELEASED WITHOUT REVIEW OF THE SERVICE PROVIDER.  Virtual Follow-Up Visit via Video Note  I connected with Elizabeth Werner  on 01/18/22 at 11:30 AM EST by a video enabled telemedicine application and verified that I am speaking with the correct person using two identifiers.   Patient/parent location: [facility]  Provider location: remote [place]    I discussed the limitations of evaluation and management by telemedicine and the availability of in person appointments.  I discussed that the purpose of this telehealth visit is to provide medical care while limiting exposure to the novel coronavirus.  The patient expressed understanding and agreed to proceed.   Elizabeth Werner is a 21 y.o. female referred by Aggie Hacker, MD here today for follow-up of adjustment disoder with mixed anxiety and depressed mood, eating disorder.   History was provided by the patient.  Supervising Physician: Dr. Theadore Nan   Plan from Last Visit 11/30/21:   1. Adjustment disorder with mixed anxiety and depressed mood -increase lexapro from 10 mg to 15 mg  -return precautions reviewed; check back in 2 weeks or sooner if needed    2. Eating disorder, unspecified type -referral to [facility] for ongoing management, chart to Rowland Lathe  -will confirm on ROI for Pattricia Boss and reach out      [facility] screenings:      11/30/2021   11:29 AM 10/17/2021    2:56 PM 10/03/2021    4:28 PM  PHQ-SADS Last 3 Score only  PHQ-15 Score 6 7 5   Total GAD-7 Score 3 7 5   PHQ Adolescent Score 4 6 9     Screens discussed with patient and parent and adjustments to plan made accordingly.    I discussed the assessment and treatment plan with the patient and/or parent/guardian.  They were provided an opportunity to ask questions and all were answered.  They agreed with the plan and demonstrated an understanding of the instructions. They were advised to call back or seek an  in-person evaluation in the emergency room if the symptoms worsen or if the condition fails to improve as anticipated.     Follow-up:   2 weeks med check, video   Chief Complaint: Restrictive eating   History of Present Illness:  -has not started the 15 mg Lexapro  -definitely not healthy eating habits but not where she should be   -24 hr food recall:  -spaghetti squash bake with salmon  -oatmeal for breakfast -sweet potato and granola bar  -oatmeal for breakfast  -microwave protein muffin  -not enough water: likes to drink flavored water and am out, so she forgets to drink   -has not had accommodations before; needs extended time on testing; her professors have recommended this    -reached out to - RD in [place] but is hoping to have a more local/[place] - would like to do in person if possible  -she will call [facility] office in [facility] today to schedule; she was not sure why they were calling; agreeable to go for DE management and med management   No Known Allergies Outpatient Medications Prior to Visit  Medication Sig Dispense Refill   escitalopram (LEXAPRO) 10 MG tablet TAKE 1 TABLET BY MOUTH EVERY DAY 90 tablet 1   No facility-administered medications prior to visit.     Patient Active Problem List   Diagnosis Date Noted   Dumping syndrome 08/13/2019   Secondary oligomenorrhea 05/08/2019   Major  depressive disorder, recurrent episode, moderate (HCC) 03/25/2019   Disordered eating 03/18/2019   Generalized anxiety disorder 11/20/2018   Adolescent idiopathic scoliosis of thoracic region 06/21/2016  The following portions of the patient's history were reviewed and updated as appropriate: allergies, current medications, past medical history, and problem list.  Visual Observations/Objective:   General Appearance: Well nourished well developed, in no apparent distress.  Eyes: conjunctiva no swelling or erythema ENT/Mouth: No hoarseness, No cough for  duration of visit.  Neck: Supple  Respiratory: Respiratory effort normal, normal rate, no retractions or distress.   Cardio: Appears well-perfused, noncyanotic Musculoskeletal: no obvious deformity Skin: visible skin without rashes, ecchymosis, erythema Neuro: Awake and oriented X 3,  Psych:  normal affect, Insight and Judgment appropriate.    Assessment/Plan:  1. Adjustment disorder with mixed anxiety and depressed mood 2. Eating disorder, unspecified type  -lexapro 15 mg; refill sent - she is currently at 10 mg - will trial increased dose  -she will reach out by my chart after call to [facility] office for scheduling  -will review accommodations information sent by my chart  -follow up pending scheduling with [facility]; otherwise 2 weeks video    I discussed the assessment and treatment plan with the patient and/or parent/guardian.  They were provided an opportunity to ask questions and all were answered.  They agreed with the plan and demonstrated an understanding of the instructions. They were advised to call back or seek an in-person evaluation in the emergency room if the symptoms worsen or if the condition fails to improve as anticipated.   Follow-up:   2 weeks pending [facility] scheduling options    Georges Mouse, NP    CC: Aggie Hacker, MD, Aggie Hacker, MD

## 2022-02-01 ENCOUNTER — Telehealth: Payer: BC Managed Care – PPO | Admitting: Family

## 2022-02-07 DIAGNOSIS — F509 Eating disorder, unspecified: Secondary | ICD-10-CM | POA: Diagnosis not present

## 2022-02-07 DIAGNOSIS — N911 Secondary amenorrhea: Secondary | ICD-10-CM | POA: Diagnosis not present

## 2022-02-07 DIAGNOSIS — F419 Anxiety disorder, unspecified: Secondary | ICD-10-CM | POA: Diagnosis not present

## 2022-02-08 ENCOUNTER — Encounter: Payer: Self-pay | Admitting: Family

## 2022-02-08 ENCOUNTER — Encounter: Payer: Self-pay | Admitting: Obstetrics and Gynecology

## 2022-03-22 DIAGNOSIS — F509 Eating disorder, unspecified: Secondary | ICD-10-CM | POA: Diagnosis not present

## 2022-03-22 DIAGNOSIS — N911 Secondary amenorrhea: Secondary | ICD-10-CM | POA: Diagnosis not present

## 2022-03-22 DIAGNOSIS — F411 Generalized anxiety disorder: Secondary | ICD-10-CM | POA: Diagnosis not present

## 2022-03-27 DIAGNOSIS — Z713 Dietary counseling and surveillance: Secondary | ICD-10-CM | POA: Diagnosis not present

## 2022-03-31 ENCOUNTER — Other Ambulatory Visit: Payer: Self-pay | Admitting: Family

## 2022-04-03 DIAGNOSIS — R634 Abnormal weight loss: Secondary | ICD-10-CM | POA: Diagnosis not present

## 2022-04-03 DIAGNOSIS — R001 Bradycardia, unspecified: Secondary | ICD-10-CM | POA: Diagnosis not present

## 2022-04-03 DIAGNOSIS — N911 Secondary amenorrhea: Secondary | ICD-10-CM | POA: Diagnosis not present

## 2022-04-03 DIAGNOSIS — F419 Anxiety disorder, unspecified: Secondary | ICD-10-CM | POA: Diagnosis not present

## 2022-04-03 DIAGNOSIS — F509 Eating disorder, unspecified: Secondary | ICD-10-CM | POA: Diagnosis not present

## 2022-04-04 ENCOUNTER — Other Ambulatory Visit: Payer: Self-pay | Admitting: Family

## 2022-04-12 DIAGNOSIS — Z713 Dietary counseling and surveillance: Secondary | ICD-10-CM | POA: Diagnosis not present

## 2022-04-12 DIAGNOSIS — F411 Generalized anxiety disorder: Secondary | ICD-10-CM | POA: Diagnosis not present

## 2022-04-17 DIAGNOSIS — R634 Abnormal weight loss: Secondary | ICD-10-CM | POA: Diagnosis not present

## 2022-04-17 DIAGNOSIS — N911 Secondary amenorrhea: Secondary | ICD-10-CM | POA: Diagnosis not present

## 2022-04-17 DIAGNOSIS — F419 Anxiety disorder, unspecified: Secondary | ICD-10-CM | POA: Diagnosis not present

## 2022-04-19 DIAGNOSIS — F411 Generalized anxiety disorder: Secondary | ICD-10-CM | POA: Diagnosis not present

## 2022-04-19 DIAGNOSIS — F5001 Anorexia nervosa, restricting type: Secondary | ICD-10-CM | POA: Diagnosis not present

## 2022-04-19 DIAGNOSIS — Z713 Dietary counseling and surveillance: Secondary | ICD-10-CM | POA: Diagnosis not present

## 2022-04-22 ENCOUNTER — Other Ambulatory Visit: Payer: Self-pay | Admitting: Family

## 2022-04-26 DIAGNOSIS — F411 Generalized anxiety disorder: Secondary | ICD-10-CM | POA: Diagnosis not present

## 2022-04-26 DIAGNOSIS — F5001 Anorexia nervosa, restricting type: Secondary | ICD-10-CM | POA: Diagnosis not present

## 2022-05-03 DIAGNOSIS — F411 Generalized anxiety disorder: Secondary | ICD-10-CM | POA: Diagnosis not present

## 2022-05-03 DIAGNOSIS — F5001 Anorexia nervosa, restricting type: Secondary | ICD-10-CM | POA: Diagnosis not present

## 2022-05-09 DIAGNOSIS — Z713 Dietary counseling and surveillance: Secondary | ICD-10-CM | POA: Diagnosis not present

## 2022-05-10 DIAGNOSIS — F5001 Anorexia nervosa, restricting type: Secondary | ICD-10-CM | POA: Diagnosis not present

## 2022-05-10 DIAGNOSIS — F411 Generalized anxiety disorder: Secondary | ICD-10-CM | POA: Diagnosis not present

## 2022-05-16 DIAGNOSIS — Z713 Dietary counseling and surveillance: Secondary | ICD-10-CM | POA: Diagnosis not present

## 2022-05-17 DIAGNOSIS — F411 Generalized anxiety disorder: Secondary | ICD-10-CM | POA: Diagnosis not present

## 2022-05-17 DIAGNOSIS — F5001 Anorexia nervosa, restricting type: Secondary | ICD-10-CM | POA: Diagnosis not present

## 2022-05-30 DIAGNOSIS — Z713 Dietary counseling and surveillance: Secondary | ICD-10-CM | POA: Diagnosis not present

## 2022-05-30 DIAGNOSIS — F5001 Anorexia nervosa, restricting type: Secondary | ICD-10-CM | POA: Diagnosis not present

## 2022-05-31 DIAGNOSIS — F5001 Anorexia nervosa, restricting type: Secondary | ICD-10-CM | POA: Diagnosis not present

## 2022-06-06 DIAGNOSIS — Z713 Dietary counseling and surveillance: Secondary | ICD-10-CM | POA: Diagnosis not present

## 2022-06-07 DIAGNOSIS — F5001 Anorexia nervosa, restricting type: Secondary | ICD-10-CM | POA: Diagnosis not present

## 2022-06-14 DIAGNOSIS — F5001 Anorexia nervosa, restricting type: Secondary | ICD-10-CM | POA: Diagnosis not present

## 2022-06-16 DIAGNOSIS — F5001 Anorexia nervosa, restricting type: Secondary | ICD-10-CM | POA: Diagnosis not present

## 2022-06-16 DIAGNOSIS — F411 Generalized anxiety disorder: Secondary | ICD-10-CM | POA: Diagnosis not present

## 2022-06-20 DIAGNOSIS — Z713 Dietary counseling and surveillance: Secondary | ICD-10-CM | POA: Diagnosis not present

## 2022-06-23 DIAGNOSIS — F5001 Anorexia nervosa, restricting type: Secondary | ICD-10-CM | POA: Diagnosis not present

## 2022-06-23 DIAGNOSIS — F411 Generalized anxiety disorder: Secondary | ICD-10-CM | POA: Diagnosis not present

## 2022-06-26 DIAGNOSIS — F5001 Anorexia nervosa, restricting type: Secondary | ICD-10-CM | POA: Diagnosis not present

## 2022-06-26 DIAGNOSIS — F411 Generalized anxiety disorder: Secondary | ICD-10-CM | POA: Diagnosis not present

## 2022-06-29 DIAGNOSIS — Z713 Dietary counseling and surveillance: Secondary | ICD-10-CM | POA: Diagnosis not present

## 2022-07-05 DIAGNOSIS — F5001 Anorexia nervosa, restricting type: Secondary | ICD-10-CM | POA: Diagnosis not present

## 2022-07-05 DIAGNOSIS — Z713 Dietary counseling and surveillance: Secondary | ICD-10-CM | POA: Diagnosis not present

## 2022-07-05 DIAGNOSIS — F411 Generalized anxiety disorder: Secondary | ICD-10-CM | POA: Diagnosis not present

## 2022-07-17 DIAGNOSIS — F5001 Anorexia nervosa, restricting type: Secondary | ICD-10-CM | POA: Diagnosis not present

## 2022-07-17 DIAGNOSIS — Z713 Dietary counseling and surveillance: Secondary | ICD-10-CM | POA: Diagnosis not present

## 2022-07-17 DIAGNOSIS — F411 Generalized anxiety disorder: Secondary | ICD-10-CM | POA: Diagnosis not present

## 2022-07-19 ENCOUNTER — Encounter: Payer: Self-pay | Admitting: Family

## 2022-07-19 ENCOUNTER — Other Ambulatory Visit: Payer: Self-pay | Admitting: Family

## 2022-08-31 IMAGING — US US PELVIS COMPLETE
1 series · 14 of 25 positions shown · non-contrast
Comparison: None Available.

CLINICAL DATA: Oligomenorrhea

EXAM:
TRANSABDOMINAL ULTRASOUND OF PELVIS
TECHNIQUE: Transabdominal ultrasound examination of the pelvis was performed
including evaluation of the uterus, ovaries, adnexal regions, and
pelvic cul-de-sac.

[Series 1: us pelvis complete · 0.26mm/px · 14 of 34 slices shown]
[im 1/34]
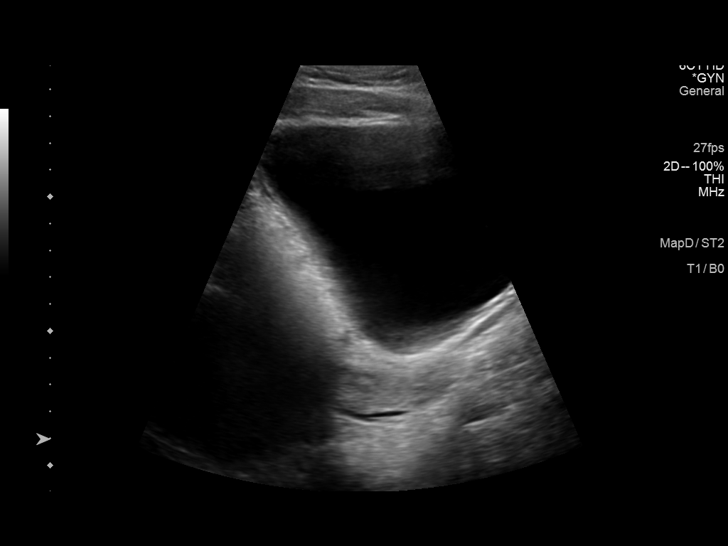
[im 3/34]
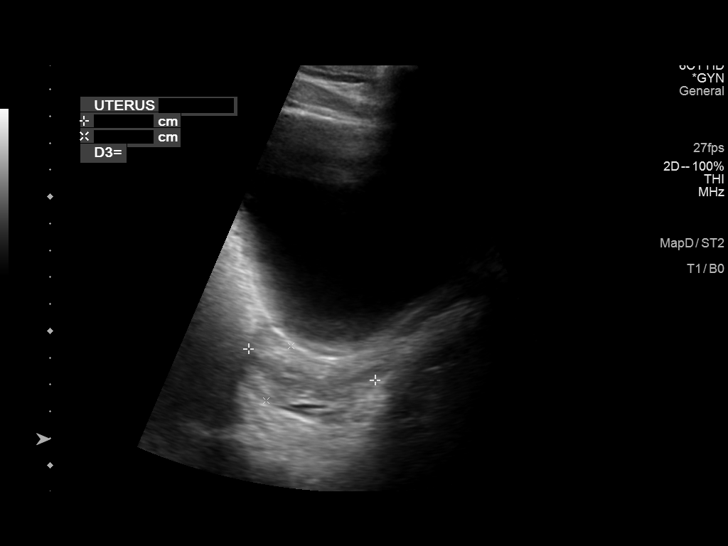
[im 6/34]
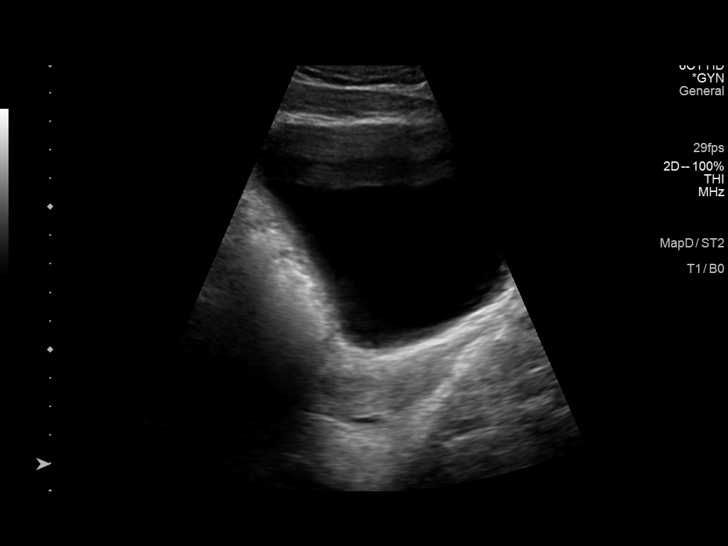
[im 9/34]
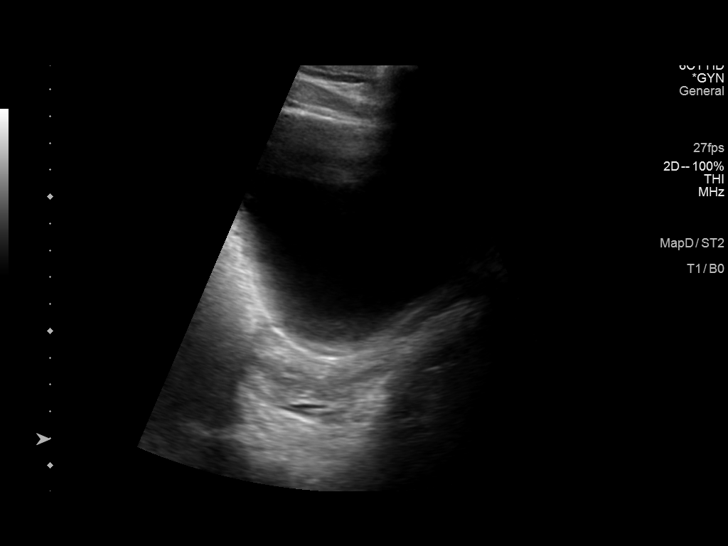
[im 12/34]
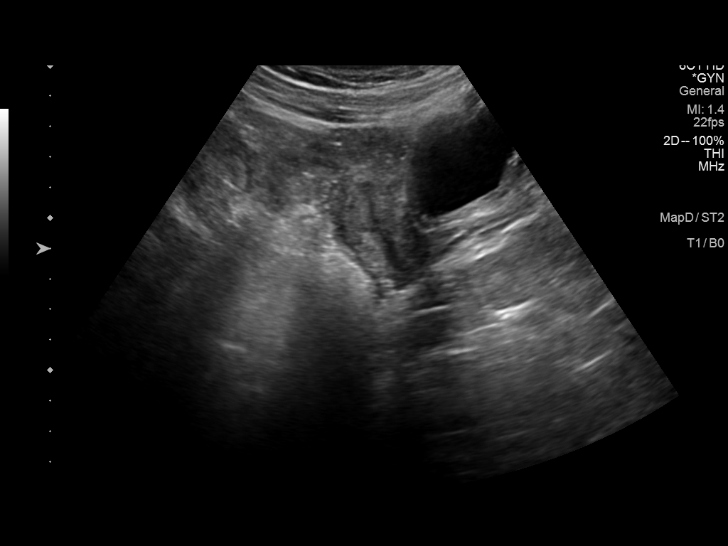
[im 13/34]
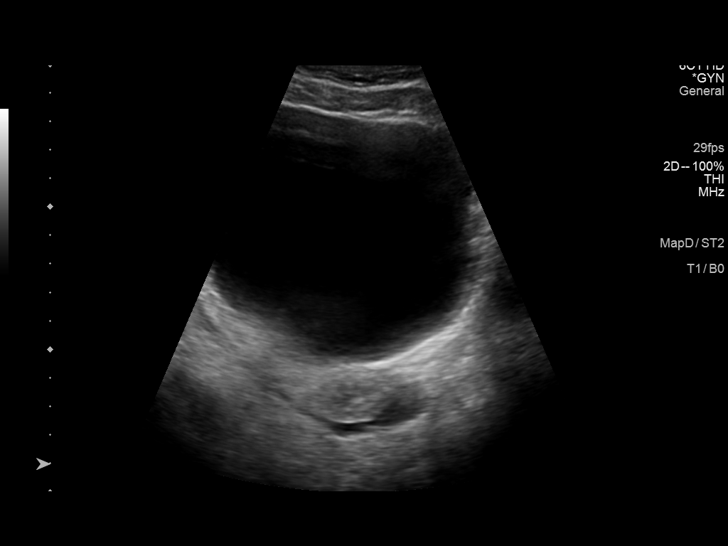
[im 16/34]
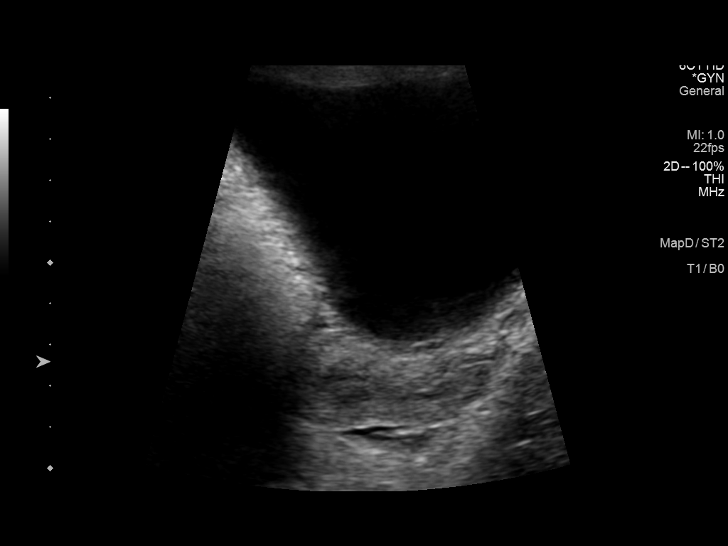
[im 18/34]
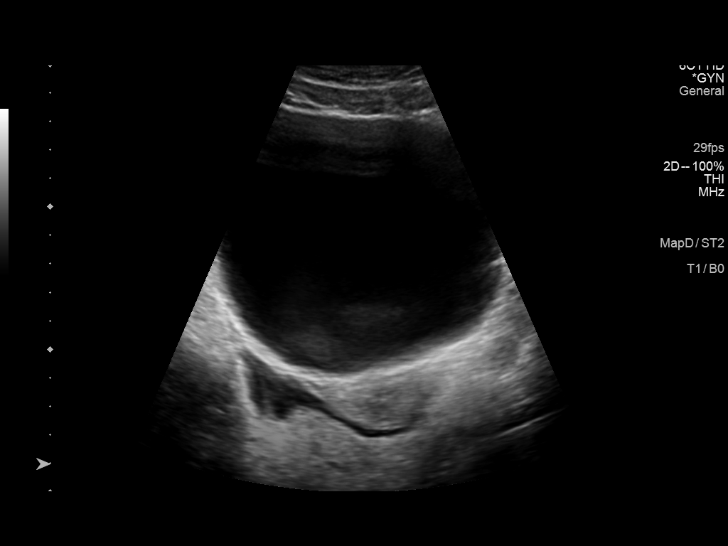
[im 21/34]
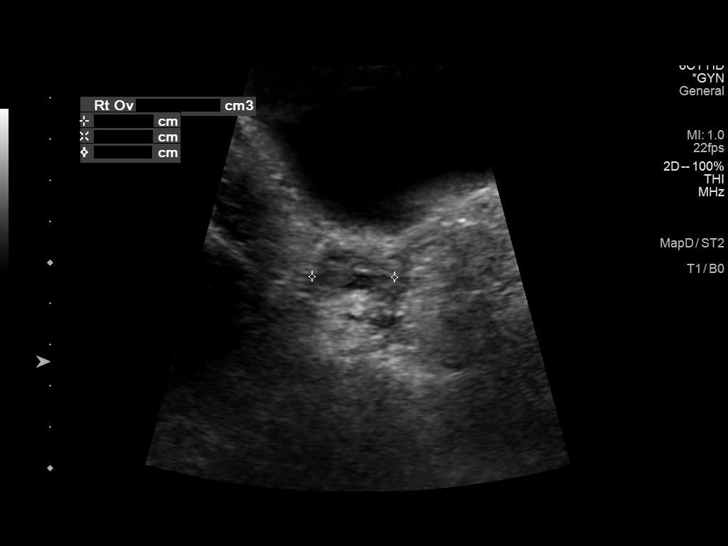
[im 23/34]
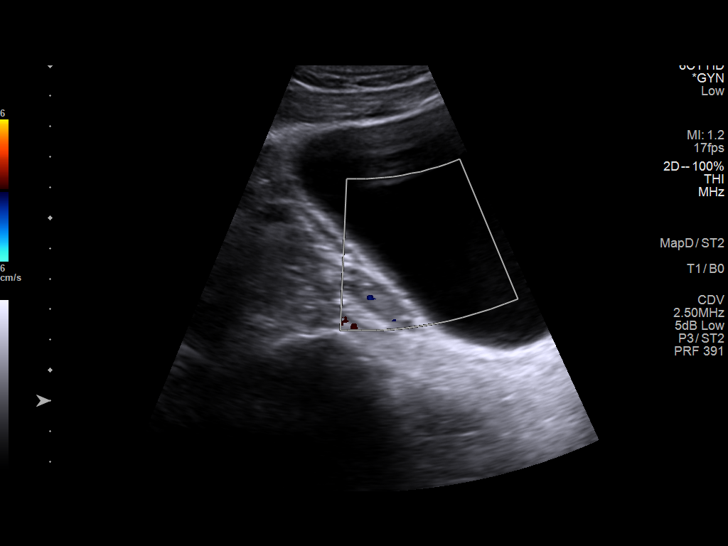
[im 25/34]
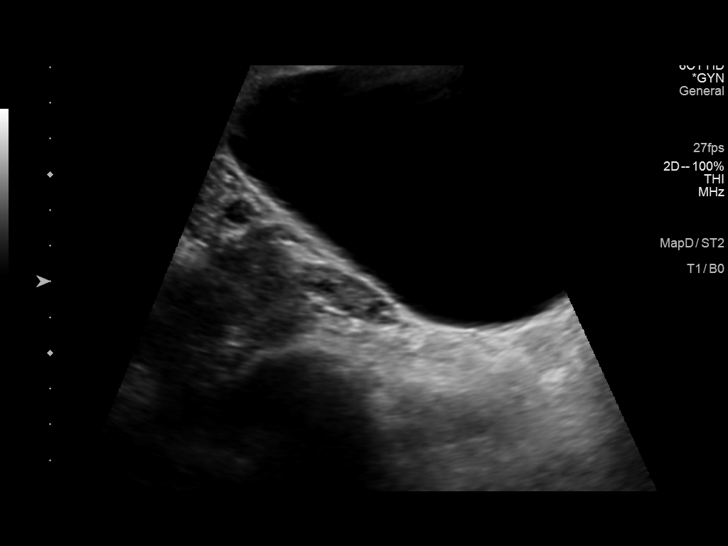
[im 28/34]
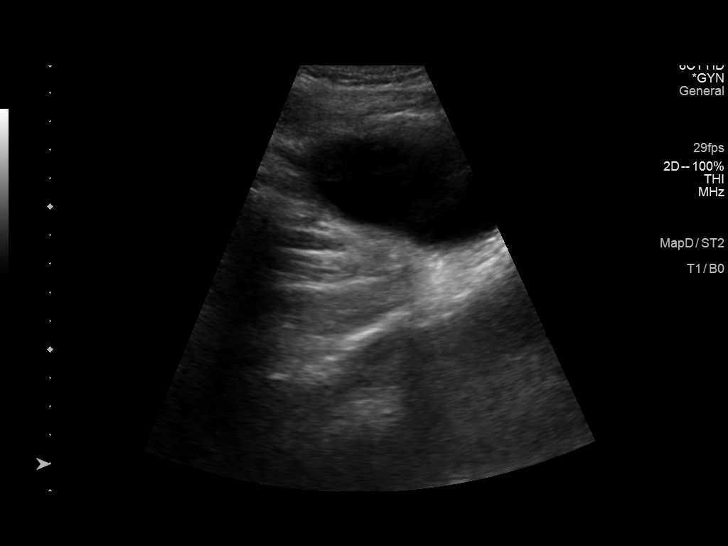
[im 31/34]
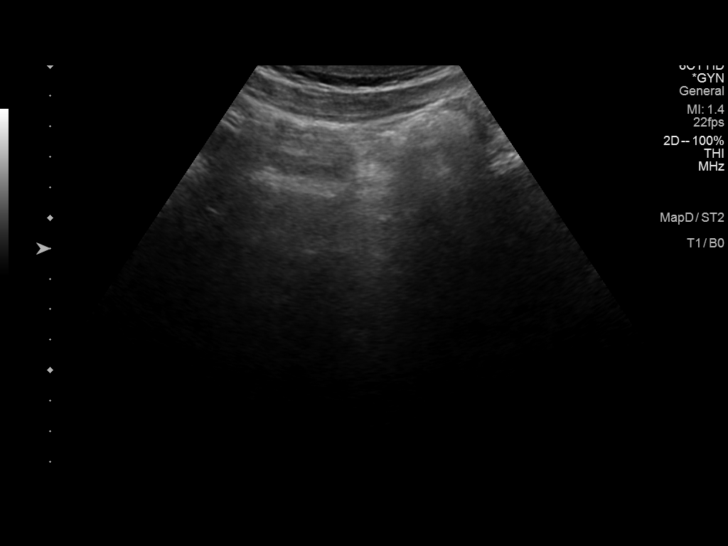
[im 34/34]
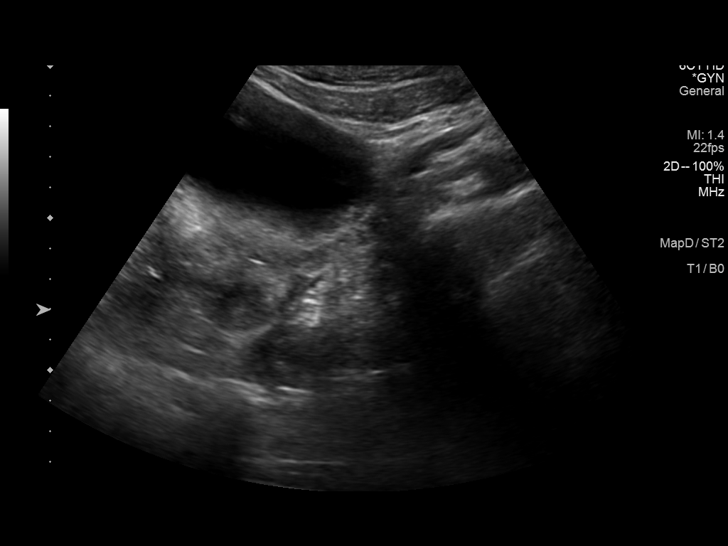

[14 of 25 positions shown; findings below may reference images not displayed]

FINDINGS: Uterus

Measurements: 4.9 x 2.2 x 3.7 cm = volume: 21 mL. No fibroids or
other mass visualized.

Endometrium

Thickness: 4.2 mm.  No focal abnormality visualized.

Right ovary

Measurements: 2.4 x 0.8 x 2 cm = volume: 2.0 mL. Normal
appearance/no adnexal mass.

Left ovary

Measurements: Not seen

Other findings:  Trace free fluid
IMPRESSION: 1. Small appearing uterus which is otherwise morphologically within
normal limits.
2. Nonvisualized left ovary.  Trace free fluid

## 2022-11-20 ENCOUNTER — Encounter: Payer: Self-pay | Admitting: Family

## 2022-12-08 DIAGNOSIS — E46 Unspecified protein-calorie malnutrition: Secondary | ICD-10-CM | POA: Diagnosis not present

## 2022-12-08 DIAGNOSIS — F419 Anxiety disorder, unspecified: Secondary | ICD-10-CM | POA: Diagnosis not present

## 2022-12-08 DIAGNOSIS — N911 Secondary amenorrhea: Secondary | ICD-10-CM | POA: Diagnosis not present

## 2022-12-08 DIAGNOSIS — F5001 Anorexia nervosa, restricting type: Secondary | ICD-10-CM | POA: Diagnosis not present

## 2022-12-13 DIAGNOSIS — E46 Unspecified protein-calorie malnutrition: Secondary | ICD-10-CM | POA: Diagnosis not present

## 2022-12-13 DIAGNOSIS — N911 Secondary amenorrhea: Secondary | ICD-10-CM | POA: Diagnosis not present

## 2022-12-18 DIAGNOSIS — Z713 Dietary counseling and surveillance: Secondary | ICD-10-CM | POA: Diagnosis not present

## 2023-01-03 DIAGNOSIS — F411 Generalized anxiety disorder: Secondary | ICD-10-CM | POA: Diagnosis not present

## 2023-01-03 DIAGNOSIS — N911 Secondary amenorrhea: Secondary | ICD-10-CM | POA: Diagnosis not present

## 2023-01-03 DIAGNOSIS — F50014 Anorexia nervosa, restricting type, in remission: Secondary | ICD-10-CM | POA: Diagnosis not present

## 2023-01-24 DIAGNOSIS — F50014 Anorexia nervosa, restricting type, in remission: Secondary | ICD-10-CM | POA: Diagnosis not present

## 2023-01-24 DIAGNOSIS — N911 Secondary amenorrhea: Secondary | ICD-10-CM | POA: Diagnosis not present

## 2023-01-24 DIAGNOSIS — F419 Anxiety disorder, unspecified: Secondary | ICD-10-CM | POA: Diagnosis not present

## 2023-02-02 ENCOUNTER — Encounter: Payer: Self-pay | Admitting: Physical Medicine and Rehabilitation

## 2023-02-06 ENCOUNTER — Other Ambulatory Visit: Payer: Self-pay | Admitting: Pediatrics

## 2023-02-06 DIAGNOSIS — N911 Secondary amenorrhea: Secondary | ICD-10-CM

## 2023-02-20 ENCOUNTER — Ambulatory Visit: Payer: BC Managed Care – PPO

## 2023-02-20 DIAGNOSIS — D2239 Melanocytic nevi of other parts of face: Secondary | ICD-10-CM | POA: Diagnosis not present

## 2023-02-20 DIAGNOSIS — D2262 Melanocytic nevi of left upper limb, including shoulder: Secondary | ICD-10-CM | POA: Diagnosis not present

## 2023-02-20 DIAGNOSIS — D2261 Melanocytic nevi of right upper limb, including shoulder: Secondary | ICD-10-CM | POA: Diagnosis not present

## 2023-02-20 DIAGNOSIS — D224 Melanocytic nevi of scalp and neck: Secondary | ICD-10-CM | POA: Diagnosis not present

## 2023-02-20 DIAGNOSIS — L905 Scar conditions and fibrosis of skin: Secondary | ICD-10-CM | POA: Diagnosis not present

## 2023-02-23 ENCOUNTER — Ambulatory Visit
Admission: RE | Admit: 2023-02-23 | Discharge: 2023-02-23 | Disposition: A | Discharge status: Home / Self Care | Payer: BC Managed Care – PPO | Source: Ambulatory Visit | Attending: Pediatrics | Admitting: Pediatrics

## 2023-02-23 DIAGNOSIS — N911 Secondary amenorrhea: Secondary | ICD-10-CM | POA: Diagnosis not present

## 2023-02-23 DIAGNOSIS — N854 Malposition of uterus: Secondary | ICD-10-CM | POA: Diagnosis not present

## 2023-02-23 DIAGNOSIS — N912 Amenorrhea, unspecified: Secondary | ICD-10-CM | POA: Diagnosis not present

## 2023-02-26 ENCOUNTER — Ambulatory Visit: Payer: BC Managed Care – PPO | Admitting: Obstetrics & Gynecology

## 2023-02-26 ENCOUNTER — Encounter: Payer: Self-pay | Admitting: Obstetrics & Gynecology

## 2023-02-26 VITALS — BP 105/58 | HR 57 | Ht 63.0 in | Wt 128.0 lb

## 2023-02-26 DIAGNOSIS — E230 Hypopituitarism: Secondary | ICD-10-CM | POA: Insufficient documentation

## 2023-02-26 DIAGNOSIS — N6320 Unspecified lump in the left breast, unspecified quadrant: Secondary | ICD-10-CM | POA: Diagnosis not present

## 2023-02-26 DIAGNOSIS — N914 Secondary oligomenorrhea: Secondary | ICD-10-CM

## 2023-02-26 NOTE — Progress Notes (Signed)
   Subjective:    Patient ID: Elizabeth Werner, female    DOB: Dec 02, 2000, 22 y.o.   MRN: [id number]  HPI  22 year old female presents for left breast mass.  She felt it for several weeks.  She recently had a dermatology screening and that dermatologist also felt the left breast lump.  She denies nipple discharge.  Patient also has secondary amenorrhea and is being managed by a specialist at [facility].  She has had an ultrasound done at [facility] and that is pending a read right now.  She is leaving to go to [place] for a study abroad mid-January.  We will get the ultrasound prior to her leaving.  She is also in need of a Pap smear and will do this upon return in the summer 2025.  Review of Systems  Constitutional: Negative.   Respiratory: Negative.    Cardiovascular: Negative.   Gastrointestinal: Negative.   Genitourinary: Negative.        Objective:   Physical Exam Vitals reviewed.  Constitutional:      General: She is not in acute distress.    Appearance: She is well-developed.  HENT:     Head: Normocephalic and atraumatic.  Eyes:     Conjunctiva/sclera: Conjunctivae normal.  Cardiovascular:     Rate and Rhythm: Normal rate.  Pulmonary:     Effort: Pulmonary effort is normal.  Chest:    Skin:    General: Skin is warm and dry.  Neurological:     Mental Status: She is alert and oriented to person, place, and time.  Psychiatric:        Mood and Affect: Mood normal.           Assessment & Plan:  22 yo female with left breast lump and need of pap smear.   Left breast mass felt approx 1 cm at 9 o'clock--breast US BEFORE she goes to [place] Pap smear and annual exam this summer Amenorrhea managed by [facility] physician--US pending read (was done at [facility]. Not sexually active--discussed condoms and plan B.  Pt will have access to birth control in [place] if she becomes sexually active.

## 2023-03-15 ENCOUNTER — Ambulatory Visit
Admission: RE | Admit: 2023-03-15 | Discharge: 2023-03-15 | Disposition: A | Discharge status: Home / Self Care | Payer: BC Managed Care – PPO | Source: Ambulatory Visit | Attending: Obstetrics & Gynecology | Admitting: Obstetrics & Gynecology

## 2023-03-15 DIAGNOSIS — N6320 Unspecified lump in the left breast, unspecified quadrant: Secondary | ICD-10-CM
